# Patient Record
Sex: Male | Born: 1980 | Race: Asian | Hispanic: No | Marital: Married | Smoking: Current some day smoker
Health system: Southern US, Community
[De-identification: ages and names within clinical notes are randomized; demographics above are authoritative.]

---

## 2013-11-17 ENCOUNTER — Emergency Department (HOSPITAL_COMMUNITY): Payer: Medicaid - Out of State

## 2013-11-17 ENCOUNTER — Encounter (HOSPITAL_COMMUNITY): Payer: Self-pay | Admitting: Emergency Medicine

## 2013-11-17 ENCOUNTER — Emergency Department (HOSPITAL_COMMUNITY)
Admission: EM | Admit: 2013-11-17 | Discharge: 2013-11-17 | Disposition: A | Discharge status: Home / Self Care | Payer: Medicaid - Out of State | Attending: Emergency Medicine | Admitting: Emergency Medicine

## 2013-11-17 DIAGNOSIS — F17200 Nicotine dependence, unspecified, uncomplicated: Secondary | ICD-10-CM | POA: Insufficient documentation

## 2013-11-17 DIAGNOSIS — N23 Unspecified renal colic: Secondary | ICD-10-CM

## 2013-11-17 DIAGNOSIS — N201 Calculus of ureter: Secondary | ICD-10-CM | POA: Insufficient documentation

## 2013-11-17 LAB — URINALYSIS, ROUTINE W REFLEX MICROSCOPIC
Bilirubin Urine: NEGATIVE
Glucose, UA: NEGATIVE mg/dL
Ketones, ur: NEGATIVE mg/dL
Leukocytes, UA: NEGATIVE
Nitrite: NEGATIVE
Protein, ur: NEGATIVE mg/dL
Specific Gravity, Urine: 1.027 (ref 1.005–1.030)
Urobilinogen, UA: 0.2 mg/dL (ref 0.0–1.0)
pH: 5.5 (ref 5.0–8.0)

## 2013-11-17 LAB — BASIC METABOLIC PANEL
BUN: 19 mg/dL (ref 6–23)
CO2: 25 mEq/L (ref 19–32)
Calcium: 9.4 mg/dL (ref 8.4–10.5)
Chloride: 102 mEq/L (ref 96–112)
Creatinine, Ser: 0.97 mg/dL (ref 0.50–1.35)
Glucose, Bld: 135 mg/dL — ABNORMAL HIGH (ref 70–99)
POTASSIUM: 3.4 meq/L — AB (ref 3.7–5.3)
Sodium: 142 mEq/L (ref 137–147)

## 2013-11-17 LAB — URINE MICROSCOPIC-ADD ON

## 2013-11-17 MED ORDER — SODIUM CHLORIDE 0.9 % IV SOLN
Freq: Once | INTRAVENOUS | Status: AC
  Administered 2013-11-17: 10:00:00 via INTRAVENOUS

## 2013-11-17 MED ORDER — OXYCODONE-ACETAMINOPHEN 5-325 MG PO TABS
1.0000 | ORAL_TABLET | Freq: Once | ORAL | Status: AC
  Administered 2013-11-17: 1 via ORAL
  Filled 2013-11-17: qty 1

## 2013-11-17 MED ORDER — TAMSULOSIN HCL 0.4 MG PO CAPS: 0.4000 mg | ORAL_CAPSULE | Freq: Every day | ORAL | Status: AC

## 2013-11-17 MED ORDER — HYDROMORPHONE HCL PF 1 MG/ML IJ SOLN
1.0000 mg | Freq: Once | INTRAMUSCULAR | Status: AC
  Administered 2013-11-17: 1 mg via INTRAVENOUS
  Filled 2013-11-17: qty 1

## 2013-11-17 MED ORDER — ONDANSETRON HCL 4 MG/2ML IJ SOLN
4.0000 mg | Freq: Once | INTRAMUSCULAR | Status: AC
  Administered 2013-11-17: 4 mg via INTRAVENOUS
  Filled 2013-11-17: qty 2

## 2013-11-17 MED ORDER — OXYCODONE-ACETAMINOPHEN 5-325 MG PO TABS: 1.0000 | ORAL_TABLET | Freq: Four times a day (QID) | ORAL | Status: AC | PRN

## 2013-11-17 MED ORDER — KETOROLAC TROMETHAMINE 30 MG/ML IJ SOLN
30.0000 mg | Freq: Once | INTRAMUSCULAR | Status: AC
  Administered 2013-11-17: 30 mg via INTRAVENOUS
  Filled 2013-11-17: qty 1

## 2013-11-17 MED ORDER — IBUPROFEN 600 MG PO TABS: 600.0000 mg | ORAL_TABLET | Freq: Four times a day (QID) | ORAL | Status: AC | PRN

## 2013-11-17 NOTE — ED Notes (Signed)
Pt states he woke up this am with RLQ abd pain. Denies n/v/d, fever or abd pain prior to this am.

## 2013-11-17 NOTE — Discharge Instructions (Signed)
Please read and follow all provided instructions.  Your diagnoses today include:  1. Ureteral colic     Tests performed today include:  Urine test that showed blood in your urine and no infection  CT scan which showed a 2-3 millimeter kidney on the right side  Blood test that showed normal kidney function  Vital signs. See below for your results today.   Medications prescribed:   Percocet (oxycodone/acetaminophen) - narcotic pain medication  DO NOT drive or perform any activities that require you to be awake and alert because this medicine can make you drowsy. BE VERY CAREFUL not to take multiple medicines containing Tylenol (also called acetaminophen). Doing so can lead to an overdose which can damage your liver and cause liver failure and possibly death.   Ibuprofen (Motrin, Advil) - anti-inflammatory pain medication  Do not exceed 600mg  ibuprofen every 6 hours, take with food  You have been prescribed an anti-inflammatory medication or NSAID. Take with food. Take smallest effective dose for the shortest duration needed for your pain. Stop taking if you experience stomach pain or vomiting.    Flomax (tamsulosin) - relaxes smooth muscle to help kidney stones pass  Take any prescribed medications only as directed.  Home care instructions:  Follow any educational materials contained in this packet.  Please double your fluid intake for the next several days. Strain your urine and save any stones that may pass.   Follow-up instructions: Please follow-up with your urologist or the urologist referral (provided on front page) in the next 1 week for further evaluation of your symptoms.  If you need to return to the Emergency Department, go to [facility] and not [facility]. The [facility]rologists are located at [facility] and can better care for you at this location.  If you do not have a primary care doctor -- see below for referral information.   Return  instructions:  If you need to return to the Emergency Department, go to [facility] and not [facility]. The urologists are located at [facility] and can better care for you at this location.   Please return to the Emergency Department if you experience worsening symptoms.  Please return if you develop fever or uncontrolled pain or vomiting.  Please return if you have any other emergent concerns.  Additional Information:  Your vital signs today were: BP 135/103   Pulse 74   Temp(Src) 99.6 F (37.6 C) (Oral)   Resp 16   SpO2 100% If your blood pressure (BP) was elevated above 135/85 this visit, please have this repeated by your doctor within one month. --------------

## 2013-11-17 NOTE — ED Notes (Signed)
Pt has urinal and made aware of need for urine specimen. 

## 2013-11-17 NOTE — ED Provider Notes (Signed)
CSN: [id number]     Arrival date & time 11/17/13  0849 History   First MD Initiated Contact with Patient 11/17/13 [phone]     Chief Complaint  Patient presents with  . Abdominal Pain     (Consider location/radiation/quality/duration/timing/severity/associated sxs/prior Treatment) HPI Comments: Patient with no past surgical history presents with complaint of acute onset of right lower quadrant abdominal pain at 7 AM today. Pain is severe and radiates to his groin. He denies back pain. Patient thinks he saw a small amount of blood in his urine. No fever, nausea, vomiting, or diarrhea. No past history of kidney stones. No treatments prior to arrival. The onset of this condition was acute. The course is constant. Aggravating factors: none. Alleviating factors: none.    The history is provided by the patient. A language interpreter was used (family at bedside).    History reviewed. No pertinent past medical history. History reviewed. No pertinent past surgical history. History reviewed. No pertinent family history. History  Substance Use Topics  . Smoking status: Current Some Day Smoker  . Smokeless tobacco: Not on file  . Alcohol Use: No    Review of Systems  Constitutional: Negative for fever.  HENT: Negative for rhinorrhea and sore throat.   Eyes: Negative for redness.  Respiratory: Negative for cough.   Cardiovascular: Negative for chest pain.  Gastrointestinal: Positive for abdominal pain. Negative for nausea, vomiting and diarrhea.  Genitourinary: Positive for hematuria and flank pain. Negative for dysuria.  Musculoskeletal: Negative for myalgias.  Skin: Negative for rash.  Neurological: Negative for headaches.      Allergies  Review of patient's allergies indicates no known allergies.  Home Medications   Current Outpatient Rx  Name  Route  Sig  Dispense  Refill  . ibuprofen (ADVIL,MOTRIN) 600 MG tablet   Oral   Take 1 tablet (600 mg total) by mouth every 6 (six) hours  as needed.   20 tablet   0   . oxyCODONE-acetaminophen (PERCOCET/ROXICET) 5-325 MG per tablet   Oral   Take 1-2 tablets by mouth every 6 (six) hours as needed for severe pain.   20 tablet   0   . tamsulosin (FLOMAX) 0.4 MG CAPS capsule   Oral   Take 1 capsule (0.4 mg total) by mouth daily.   7 capsule   0    BP 135/103  Pulse 74  Temp(Src) 99.6 F (37.6 C) (Oral)  Resp 16  SpO2 100% Physical Exam  Nursing note and vitals reviewed. Constitutional: He appears well-developed and well-nourished. He appears distressed.  HENT:  Head: Normocephalic and atraumatic.  Right Ear: External ear normal.  Left Ear: External ear normal.  Mouth/Throat: Oropharynx is clear and moist.  Eyes: Conjunctivae are normal. Right eye exhibits no discharge. Left eye exhibits no discharge.  Neck: Normal range of motion. Neck supple.  Cardiovascular: Normal rate, regular rhythm and normal heart sounds.   No murmur heard. Pulmonary/Chest: Effort normal and breath sounds normal. No respiratory distress. He has no wheezes. He has no rales.  Abdominal: Soft. There is no tenderness. There is no rebound and no guarding.  Genitourinary: Testes normal and penis normal.  Neurological: He is alert.  Skin: Skin is warm and dry.  Psychiatric: He has a normal mood and affect.    ED Course  Procedures (including critical care time) Labs Review Labs Reviewed  BASIC METABOLIC PANEL - Abnormal; Notable for the following:    Potassium 3.4 (*)    Glucose, Bld 135 (*)  All other components within normal limits  URINALYSIS, ROUTINE W REFLEX MICROSCOPIC - Abnormal; Notable for the following:    Hgb urine dipstick LARGE (*)    All other components within normal limits  URINE MICROSCOPIC-ADD ON   Imaging Review Ct Abdomen Pelvis Wo Contrast  11/17/2013   CLINICAL DATA:  Right lower quadrant pain  EXAM: CT ABDOMEN AND PELVIS WITHOUT CONTRAST  TECHNIQUE: Multidetector CT imaging of the abdomen and pelvis was  performed following the standard protocol without IV contrast.  COMPARISON:  None.  FINDINGS: Clear lung bases. Normal heart size. No pericardial or pleural effusion. Negative for hiatal hernia.  Abdomen: Right kidney demonstrates minor perinephric stranding edema, pelviectasis, and diffuse associated right hydroureter. This is secondary to a mildly obstructing distal right UVJ 2 mm calculus in the pelvis, image 83. No additional right urinary tract calculi.  Left kidney demonstrates punctate nonobstructing lower pole intrarenal calculus. Left kidney and ureter demonstrate no acute process or obstruction.  Liver, gallbladder, biliary system, pancreas, spleen, accessory splenule, and adrenal glands are within normal limits for age and noncontrast imaging.  Negative for bowel obstruction, dilatation, ileus, or free air.  No abdominal free fluid, fluid collection, hemorrhage, abscess, or adenopathy.  Negative for aneurysm.  Normal appendix demonstrated.  Pelvis: 2 mm mildly obstructing right UVJ calculus again demonstrated. No pelvic free fluid, fluid collection, hemorrhage, abscess, adenopathy, inguinal abnormality, or hernia. No acute distal bowel process.  IMPRESSION: 2 MM MILDLY OBSTRUCTING RIGHT DISTAL UVJ CALCULUS WITH MINOR RIGHT PELVIECTASIS AND ASSOCIATED HYDROURETER.  PUNCTATE NONOBSTRUCTING LEFT LOWER POLE INTRARENAL CALCULUS.  NO OTHER ACUTE INTRA-ABDOMINAL OR PELVIC FINDING BY NONCONTRAST CT.   Electronically Signed   By: Ruel Favors M.D.   On: 11/17/2013 10:21     EKG Interpretation None      9:23 AM Patient seen and examined. Work-up initiated. Medications ordered. Suspect ureteral colic at this point. Will CT, check labs.   Vital signs reviewed and are as follows: Filed Vitals:   11/17/13 0907  BP: 135/103  Pulse: 74  Temp: 99.6 F (37.6 C)  Resp: 16   11:32 AM ureteral stone confirmed with CT scan. Patient and family informed of findings. Patient is feeling much better after  Dilaudid and Toradol.  Patient counseled on kidney stone treatment. Urged patient to strain urine and save any stones. Urged urology follow-up and return to [facility] with any complications. Counseled patient to maintain good fluid intake.   Counseled patient on use of Flomax.   Patient counseled on use of narcotic pain medications. Counseled not to combine these medications with others containing tylenol. Urged not to drink alcohol, drive, or perform any other activities that requires focus while taking these medications. The patient verbalizes understanding and agrees with the plan.     MDM   Final diagnoses:  Ureteral colic   Patient with flank pain consistent with ureteral colic, confirmed on noncontrasted CT scan.  No urinary tract infection. Symptoms improved in emergency department. Patient is stable and appropriate for outpatient therapy, urology followup as needed.    Renne Crigler, PA-C 11/17/13 1133

## 2013-11-17 NOTE — Progress Notes (Signed)
   CARE MANAGEMENT ED NOTE 11/17/2013  Patient:  David Wade,David Wade   Account Number:  [id number]  Date Initiated:  11/17/2013  Documentation initiated by:  Edd ArbourGIBBS,Shawndale Kilpatrick  Subjective/Objective Assessment:   32 yr medicaid [place] of state [place]/o woke up with RLQ abd pain     Subjective/Objective Assessment Detail:   Pt's wife request resources from CM to assist if they do decide to stay in [place]  Wife & pt voiced understanding     Action/Plan:   CM reviewed EPIC notes, scanned Medicaid card EPIC updated Cm spoke with pt and family at bedside to confirm pt will be returning to [place] and not becoming a [place] resident CM provided wife with list of [place] medicaid MDs & DSS contact info   Action/Plan Detail:   CM reviewed how med[place] would need [place] by contacting [place] DSS office preferrably if not the [place] DSS office   Anticipated DC Date:       Status Recommendation to Physician:   Result of Recommendation:    Other ED Services  Consult Working Plan    DC [employer]  Other  Outpatient Services - Pt will follow up  PCP issues    Choice offered to / List presented to:            Status of service:  Completed, signed off  ED Comments:   ED Comments Detail:

## 2013-11-18 NOTE — ED Provider Notes (Signed)
Medical screening examination/treatment/procedure(s) were performed by non-physician practitioner and as supervising physician I was immediately available for consultation/collaboration.   EKG Interpretation None        Junius ArgyleForrest S Ayari Liwanag, MD 11/18/13 1051

## 2014-03-16 ENCOUNTER — Ambulatory Visit (INDEPENDENT_AMBULATORY_CARE_PROVIDER_SITE_OTHER): Payer: Self-pay | Admitting: Emergency Medicine

## 2014-03-16 VITALS — BP 108/78 | HR 70 | Temp 99.3°F | Resp 16 | Ht 66.0 in | Wt 145.0 lb

## 2014-03-16 DIAGNOSIS — IMO0002 Reserved for concepts with insufficient information to code with codable children: Secondary | ICD-10-CM

## 2014-03-16 DIAGNOSIS — T18108A Unspecified foreign body in esophagus causing other injury, initial encounter: Secondary | ICD-10-CM

## 2014-03-16 NOTE — Patient Instructions (Signed)
Swallowed Foreign Body, Adult °You have swallowed an object (foreign body). Once the foreign body has passed through the food tube (esophagus), which leads from the mouth to the stomach, it will usually continue through the body without problems. This is because the point where the esophagus enters into the stomach is the narrowest place through which the foreign body must pass. °Sometimes the foreign body gets stuck. The most common type of foreign body obstruction in adults is food impaction. Many times, bones from fish or meat products may become lodged in the esophagus or injure the throat on the way down. When there is an object that obstructs the esophagus, the most obvious symptoms are pain and the inability to swallow normally. In some cases, foreign bodies that can be life threatening are swallowed. Examples of these are certain medications and illicit drugs. Often in these instances, patients are afraid of telling what they swallowed. However, it is extremely important to tell the emergency caregiver what was swallowed because life-saving treatment may be needed.  °X-ray exams may be taken to find the location of the foreign body. However, some objects do not show up well or may be too small to be seen on an X-ray image. If the foreign body is too large or too sharp, it may be too dangerous to allow it to pass on its own. You may need to see a caregiver who specializes in the digestive system (gastroenterologist). In a few cases, a specialist may need to remove the object using a method called "endoscopy".  This involves passing a thin, soft, flexible tube into the food pipe to locate and remove the object. Follow up with your primary doctor or the referral you were given by the emergency caregiver. °HOME CARE INSTRUCTIONS  °· If your caregiver says it is safe for you to eat, then only have liquids and soft foods until your symptoms improve. °· Once you are eating normally: °¨ Cut food into small  pieces. °¨ Remove small bones from food. °¨ Remove large seeds and pits from fruit. °¨ Chew your food well. °¨ Do not talk, laugh, or engage in physical activity while eating or swallowing. °SEEK MEDICAL CARE IF: °· You develop worsening shortness of breath, uncontrollable coughing, chest pains or high fever, greater than 102° F (38.9° C). °· You are unable to eat or drink or you feel that food is getting stuck in your throat. °· You have choking symptoms or cannot stop drooling. °· You develop abdominal pain, vomiting (especially of blood), or rectal bleeding. °MAKE SURE YOU:  °· Understand these instructions. °· Will watch your condition. °· Will get help right away if you are not doing well or get worse. °Document Released: 02/11/2010 Document Revised: 11/16/2011 Document Reviewed: 02/11/2010 °ExitCare® Patient Information ©2015 [employer]. This information is not intended to replace advice given to you by your health care provider. Make sure you discuss any questions you have with your health care provider. ° °

## 2014-03-16 NOTE — Progress Notes (Signed)
[facility] [address], [place] [id number] [phone]  Date:  03/16/2014   Name:  David GoonQianshun Shores   DOB:  Jan 07, 1981   MRN:  [id number]  PCP:  PROVIDER NOT IN SYSTEM    Chief Complaint: Sore Throat   History of Present Illness:  David Wade is a 33 y.o. very pleasant male patient who presents with the following:  Ate chicken yesterday and thinks he swallowed a bone that is "stuck" in throat.  Since eating the chicken he has eaten rice and crackers.  Is able to swallow secretions.  No fever or chills, nausea or vomiting.  . No vomiting blood.  No improvement with over the counter medications or other home remedies. Denies other complaint or health concern today.   There are no active problems to display for this patient.   No past medical history on file.  No past surgical history on file.  History  Substance Use Topics  . Smoking status: Current Some Day Smoker  . Smokeless tobacco: Not on file  . Alcohol Use: No    No family history on file.  No Known Allergies  Medication list has been reviewed and updated.  Current Outpatient Prescriptions on File Prior to Visit  Medication Sig Dispense Refill  . ibuprofen (ADVIL,MOTRIN) 600 MG tablet Take 1 tablet (600 mg total) by mouth every 6 (six) hours as needed.  20 tablet  0  . oxyCODONE-acetaminophen (PERCOCET/ROXICET) 5-325 MG per tablet Take 1-2 tablets by mouth every 6 (six) hours as needed for severe pain.  20 tablet  0  . tamsulosin (FLOMAX) 0.4 MG CAPS capsule Take 1 capsule (0.4 mg total) by mouth daily.  7 capsule  0   No current facility-administered medications on file prior to visit.    Review of Systems:  As per HPI, otherwise negative.    Physical Examination: Filed Vitals:   03/16/14 1535  BP: 108/78  Pulse: 70  Temp: 99.3 F (37.4 C)  Resp: 16   Filed Vitals:   03/16/14 1535  Height: 5\' 6"  (1.676 m)  Weight: 145 lb (65.772 kg)   Body mass index is 23.41  kg/(m^2). Ideal Body Weight: Weight in (lb) to have BMI = 25: 154.6  GEN: WDWN, NAD, Non-toxic, A & O x 3 HEENT: Atraumatic, Normocephalic. Neck supple. No masses, No LAD. Ears and Nose: No external deformity. CV: RRR, No M/G/R. No JVD. No thrill. No extra heart sounds. PULM: CTA B, no wheezes, crackles, rhonchi. No retractions. No resp. distress. No accessory muscle use. ABD: S, NT, ND, +BS. No rebound. No HSM. EXTR: No c/c/e NEURO Normal gait.  PSYCH: Normally interactive. Conversant. Not depressed or anxious appearing.  Calm demeanor.    Assessment and Plan: Discussed with patient through interpreter.  Since he is able to swallow food with no difficulty and the inability to certainly visualize a small bone on plain films, the only options are CT and endoscopy.  As they have no insurance, they would prefer observation.  Can call tomorrow if worse Possible GI fb.  Signed,  Phillips OdorJeffery Lenus Trauger, MD

## 2015-03-18 IMAGING — CT CT ABD-PELV W/O CM
1 series · 12 of 32 positions shown, 15 images · non-contrast
Comparison: None.

CLINICAL DATA: Right lower quadrant pain

EXAM:
CT ABDOMEN AND PELVIS WITHOUT CONTRAST
TECHNIQUE: Multidetector CT imaging of the abdomen and pelvis was performed
following the standard protocol without IV contrast.

[Series 6: sagittal · sagittal · 0.74mm/px · 12 of 127 slices shown, 15 images]
[im 5/127  lung]
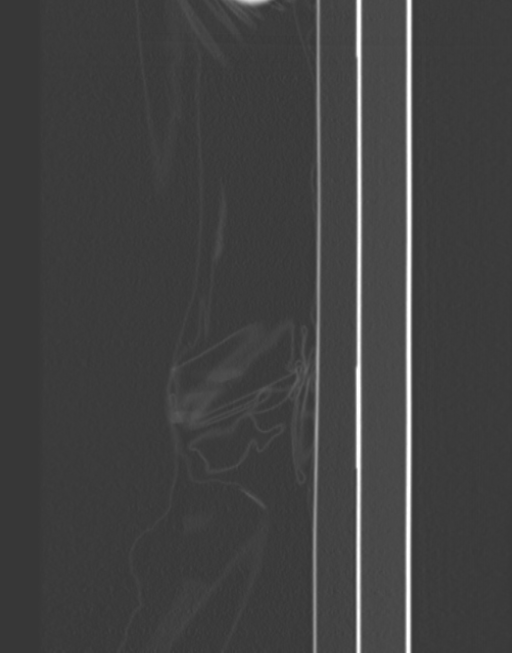
[im 9/127  soft-tissue]
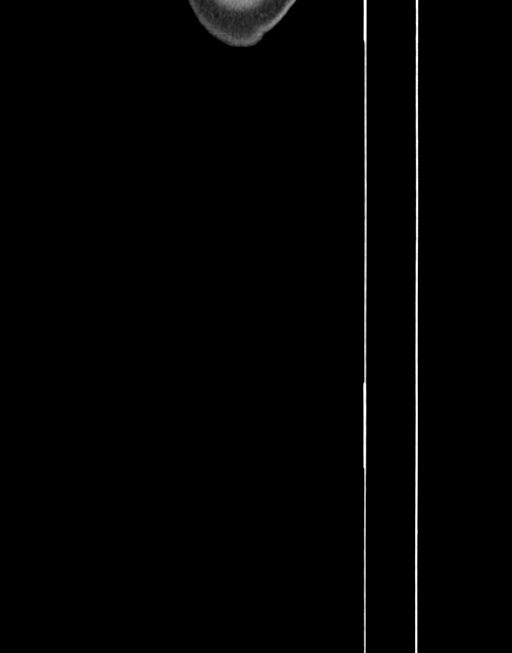
[im 9/127  lung]
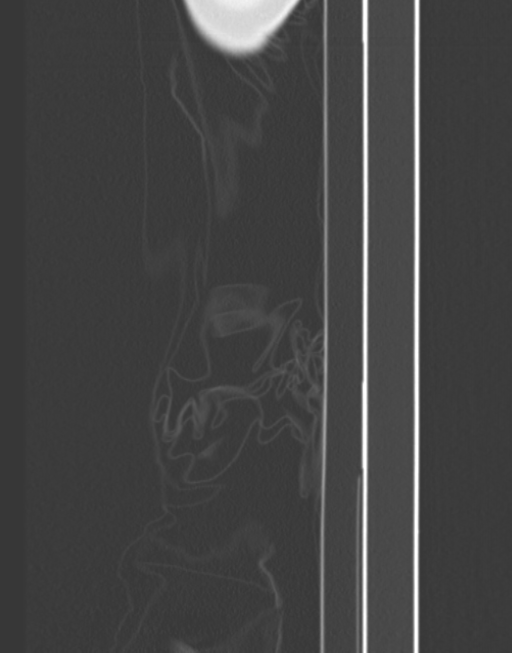
[im 9/127  bone]
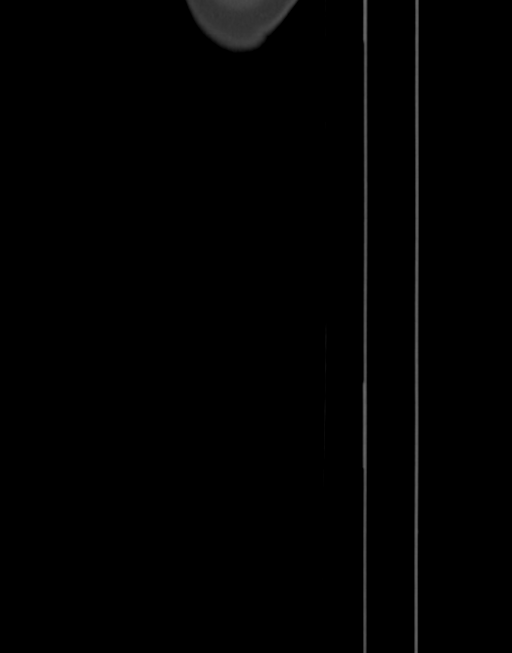
[im 13/127  lung]
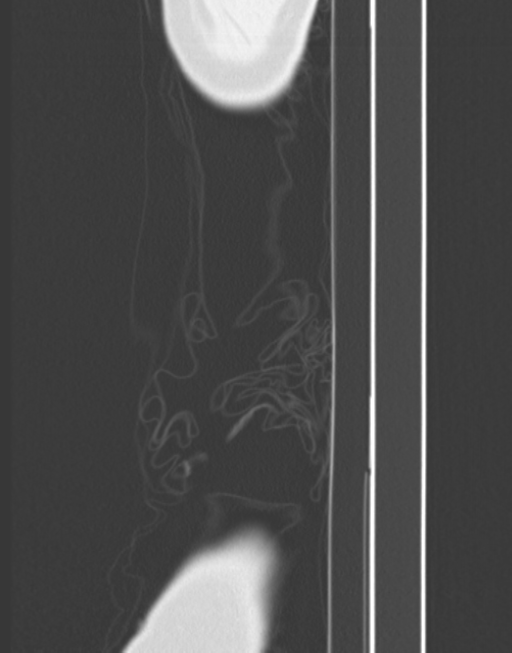
[im 17/127  lung]
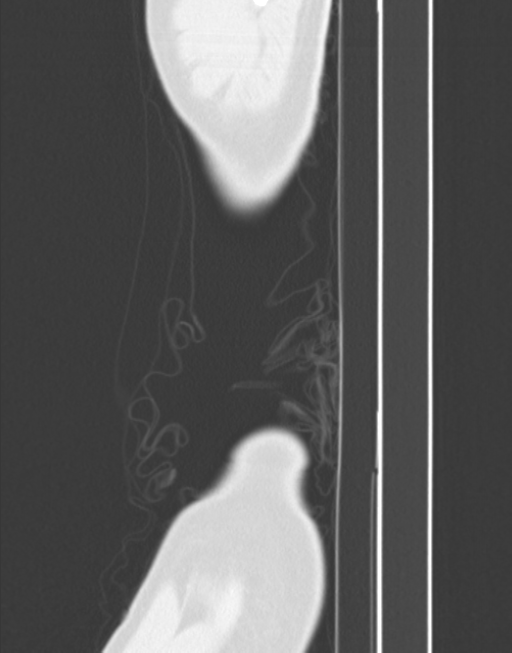
[im 25/127  soft-tissue]
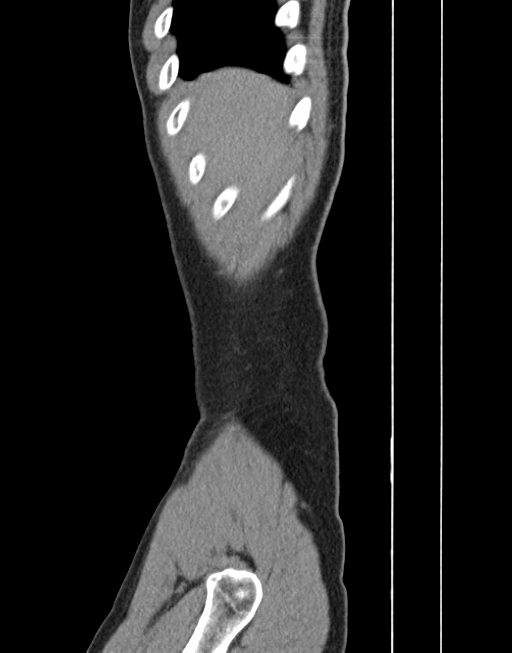
[im 37/127  soft-tissue]
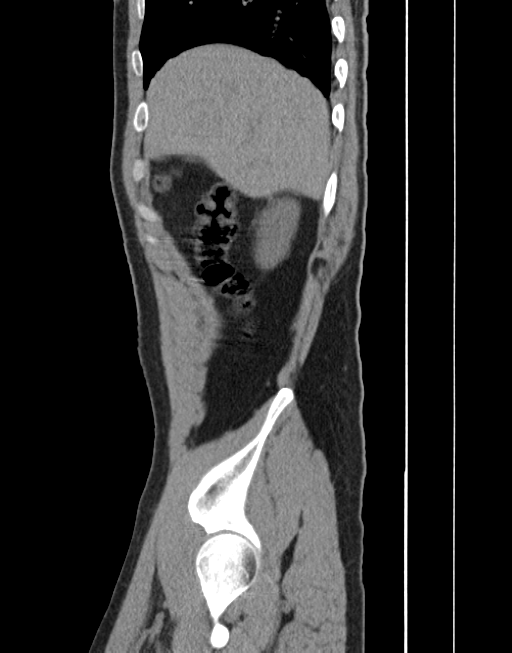
[im 49/127  soft-tissue]
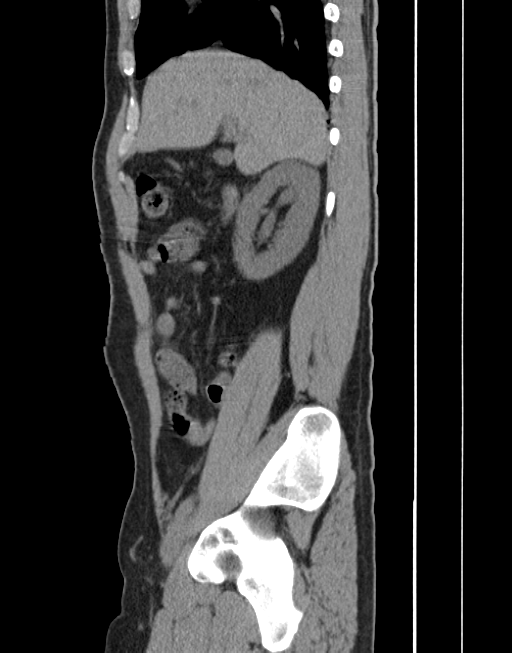
[im 66/127  soft-tissue]
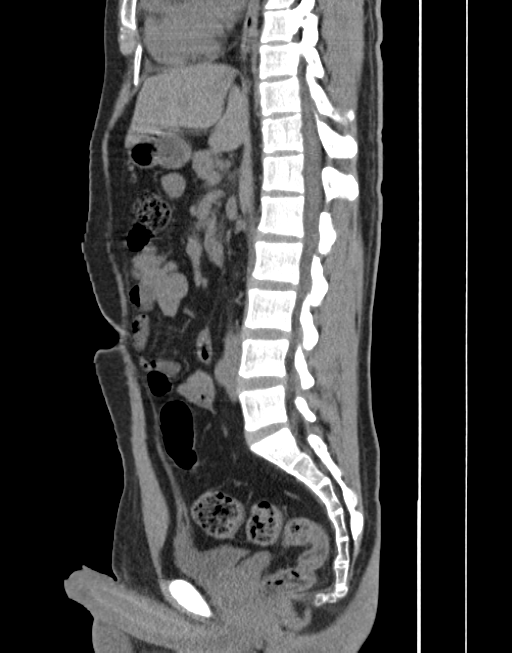
[im 78/127  soft-tissue]
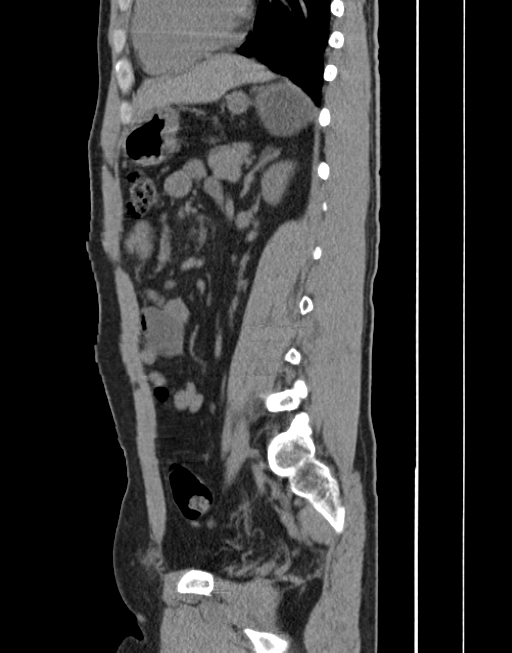
[im 90/127  soft-tissue]
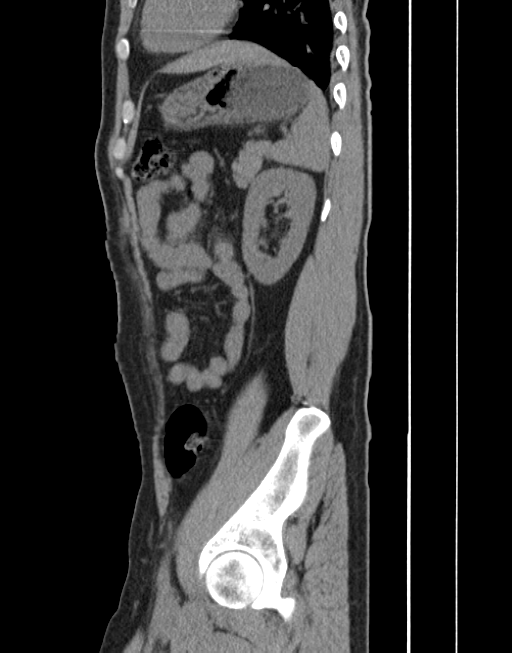
[im 106/127  soft-tissue]
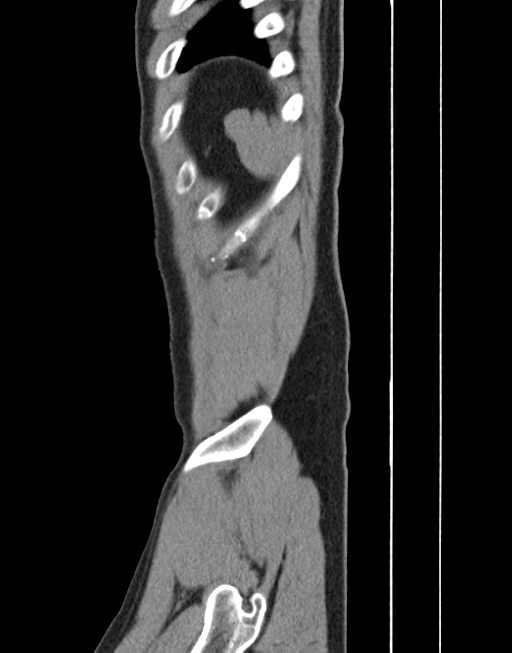
[im 118/127  soft-tissue]
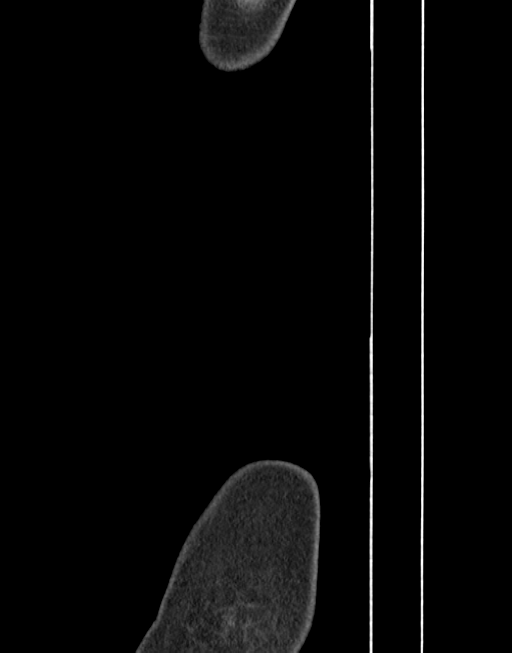
[im 118/127  bone]
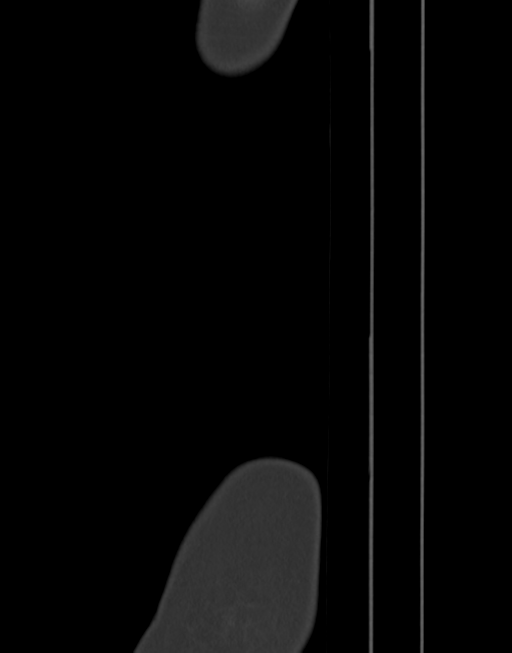

[12 of 32 positions shown; findings below may reference images not displayed]

FINDINGS: Clear lung bases. Normal heart size. No pericardial or pleural
effusion. Negative for hiatal hernia.

Abdomen: Right kidney demonstrates minor perinephric stranding
edema, pelviectasis, and diffuse associated right hydroureter. This
is secondary to a mildly obstructing distal right UVJ 2 mm calculus
in the pelvis, image 83. No additional right urinary tract calculi.

Left kidney demonstrates punctate nonobstructing lower pole
intrarenal calculus. Left kidney and ureter demonstrate no acute
process or obstruction.

Liver, gallbladder, biliary system, pancreas, spleen, accessory
splenule, and adrenal glands are within normal limits for age and
noncontrast imaging.

Negative for bowel obstruction, dilatation, ileus, or free air.

No abdominal free fluid, fluid collection, hemorrhage, abscess, or
adenopathy.

Negative for aneurysm.  Normal appendix demonstrated.

Pelvis: 2 mm mildly obstructing right UVJ calculus again
demonstrated. No pelvic free fluid, fluid collection, hemorrhage,
abscess, adenopathy, inguinal abnormality, or hernia. No acute
distal bowel process.
IMPRESSION: 2 MM MILDLY OBSTRUCTING RIGHT DISTAL UVJ CALCULUS WITH MINOR RIGHT
PELVIECTASIS AND ASSOCIATED HYDROURETER.

PUNCTATE NONOBSTRUCTING LEFT LOWER POLE INTRARENAL CALCULUS.

NO OTHER ACUTE INTRA-ABDOMINAL OR PELVIC FINDING BY NONCONTRAST CT.
